# Patient Record
Sex: Female | Born: 1955 | Race: White | Hispanic: No | Marital: Married | Smoking: Never smoker
Health system: Southern US, Community
[De-identification: ages and names within clinical notes are randomized; demographics above are authoritative.]

## PROBLEM LIST (undated history)

## (undated) DIAGNOSIS — S0300XA Dislocation of jaw, unspecified side, initial encounter: Secondary | ICD-10-CM

## (undated) HISTORY — DX: Dislocation of jaw, unspecified side, initial encounter: S03.00XA

## (undated) HISTORY — PX: BREAST BIOPSY: SHX20

## (undated) HISTORY — PX: HEMORRHOID BANDING: SHX5850

---

## 2004-08-17 ENCOUNTER — Ambulatory Visit: Payer: Self-pay | Admitting: Obstetrics and Gynecology

## 2006-04-28 ENCOUNTER — Emergency Department: Payer: Self-pay | Admitting: Emergency Medicine

## 2006-04-28 ENCOUNTER — Other Ambulatory Visit: Payer: Self-pay

## 2006-08-05 ENCOUNTER — Ambulatory Visit: Payer: Self-pay | Admitting: Obstetrics and Gynecology

## 2006-08-12 ENCOUNTER — Ambulatory Visit: Payer: Self-pay | Admitting: Obstetrics and Gynecology

## 2008-08-12 ENCOUNTER — Ambulatory Visit: Payer: Self-pay | Admitting: Obstetrics and Gynecology

## 2009-02-04 HISTORY — PX: FOOT SURGERY: SHX648

## 2010-10-04 ENCOUNTER — Ambulatory Visit: Payer: Self-pay | Admitting: Obstetrics and Gynecology

## 2013-08-20 ENCOUNTER — Other Ambulatory Visit: Payer: Self-pay | Admitting: Otolaryngology

## 2013-08-20 DIAGNOSIS — H9192 Unspecified hearing loss, left ear: Secondary | ICD-10-CM

## 2013-08-28 ENCOUNTER — Other Ambulatory Visit: Payer: Self-pay

## 2013-09-04 ENCOUNTER — Ambulatory Visit
Admission: RE | Admit: 2013-09-04 | Discharge: 2013-09-04 | Disposition: A | Discharge status: Home / Self Care | Payer: Managed Care, Other (non HMO) | Source: Ambulatory Visit | Attending: Otolaryngology | Admitting: Otolaryngology

## 2013-09-04 DIAGNOSIS — H9192 Unspecified hearing loss, left ear: Secondary | ICD-10-CM

## 2013-09-04 MED ORDER — GADOBENATE DIMEGLUMINE 529 MG/ML IV SOLN
10.0000 mL | Freq: Once | INTRAVENOUS | Status: AC | PRN
  Administered 2013-09-04: 10 mL via INTRAVENOUS

## 2014-01-11 ENCOUNTER — Ambulatory Visit: Payer: Self-pay | Admitting: Obstetrics and Gynecology

## 2014-12-06 ENCOUNTER — Ambulatory Visit: Payer: Self-pay | Admitting: General Surgery

## 2014-12-12 ENCOUNTER — Encounter: Payer: Self-pay | Admitting: *Deleted

## 2014-12-14 ENCOUNTER — Other Ambulatory Visit: Payer: Self-pay | Admitting: Obstetrics and Gynecology

## 2014-12-14 DIAGNOSIS — Z1231 Encounter for screening mammogram for malignant neoplasm of breast: Secondary | ICD-10-CM

## 2014-12-26 ENCOUNTER — Ambulatory Visit (INDEPENDENT_AMBULATORY_CARE_PROVIDER_SITE_OTHER): Payer: Managed Care, Other (non HMO) | Admitting: General Surgery

## 2014-12-26 ENCOUNTER — Encounter: Payer: Self-pay | Admitting: General Surgery

## 2014-12-26 VITALS — BP 110/60 | HR 70 | Resp 12 | Ht 65.0 in | Wt 143.0 lb

## 2014-12-26 DIAGNOSIS — K602 Anal fissure, unspecified: Secondary | ICD-10-CM | POA: Diagnosis not present

## 2014-12-26 DIAGNOSIS — K6289 Other specified diseases of anus and rectum: Secondary | ICD-10-CM

## 2014-12-26 LAB — POC HEMOCCULT BLD/STL (OFFICE/1-CARD/DIAGNOSTIC): FECAL OCCULT BLD: NEGATIVE

## 2014-12-26 MED ORDER — HYDROCORTISONE 2.5 % RE CREA: 1.0000 "application " | TOPICAL_CREAM | Freq: Two times a day (BID) | RECTAL | Status: AC

## 2014-12-26 NOTE — Progress Notes (Signed)
Patient ID: Ashley Banks, female   DOB: Oct 26, 1955, 59 y.o.   MRN: [id number]  Chief Complaint  Patient presents with  . Other    hemorrhoids    HPI Ashley Banks is a 59 y.o. female here today for a evaluation of hemorrhoids. Patient states she has had them for about three month. No bleeding but a lot of burning pain with bowel movements. The pain dissipates quickly after the stool passes the anal opening. She has been aggressively cleansing the area layer area with soap and a terry cloth washcloth. No colonoscopy in the past. She has used Preparation H cream for about 2 months without benefit. Also soaking in Epson salts.  HPI  Past Medical History  Diagnosis Date  . TMJ (dislocation of temporomandibular joint)     Past Surgical History  Procedure Laterality Date  . Foot surgery Bilateral 2011  . Hemorrhoid banding      IN HER 20'S    Family History  Problem Relation Age of Onset  . Breast cancer Maternal Grandmother     Social History Social History  Substance Use Topics  . Smoking status: Never Smoker   . Smokeless tobacco: None  . Alcohol Use: No    Allergies  Allergen Reactions  . Acetaminophen Other (See Comments)    Other Reaction: abdominal cramps/pain, ulcers  . Nsaids Rash    Other Reaction: abdominal cramps/pain, ulcers  . Aspirin Other (See Comments)  . Ibuprofen Other (See Comments)  . Clarithromycin Rash  . Codeine Rash  . Latex Rash  . Penicillins Rash  . Sulfa Antibiotics Rash    Current Outpatient Prescriptions  Medication Sig Dispense Refill  . Cetirizine HCl (ZYRTEC PO) Take by mouth.    . cyclobenzaprine (FLEXERIL) 10 MG tablet TAKE 1 TABLET BY MOUTH 3 TIMES A DAY AS NEEDED FOR MUSCLE SPASM  0  . oxymetazoline (AFRIN) 0.05 % nasal spray Place 1 spray into both nostrils 2 (two) times daily.    . hydrocortisone (ANUSOL-HC) 2.5 % rectal cream Place 1 application rectally 2 (two) times daily. 30 g 1   No current facility-administered  medications for this visit.    Review of Systems Review of Systems  Constitutional: Negative.   Respiratory: Negative.   Cardiovascular: Negative.   Gastrointestinal: Positive for rectal pain. Negative for nausea, vomiting, abdominal pain, diarrhea, constipation, blood in stool, abdominal distention and anal bleeding.    Blood pressure 110/60, pulse 70, resp. rate 12, height  (1.651 m), weight 143 lb (64.864 kg).  Physical Exam Physical Exam  Constitutional: She is oriented to person, place, and time. She appears well-developed and well-nourished.  Cardiovascular: Normal rate, regular rhythm and normal heart sounds.   Pulmonary/Chest: Effort normal and breath sounds normal.  Abdominal: Soft. Normal appearance and bowel sounds are normal. There is no tenderness.  Neurological: She is alert and oriented to person, place, and time.  Skin: Skin is warm and dry.   Digital exam showed suggestion of a posterior fissure at the 5-6:00 position (dorsal lithotomy). Normal sphincter tone. Stool was Hemoccult negative.  5 mm noninflamed skin tag at 12:00. No fistula openings noted. Mild perianal skin irritation from vigorous cleansing.  Data Reviewed None available.  Assessment    Suspected anal fissure.    Plan    The patient was encouraged to discontinue using soap on the perineum. She has a handheld shower and this should do adequate job for keeping the area clean and minimize defatting the skin.  A trial of Anusol HC cream to be applied to the posterior aspect of the anus was encouraged. This we done twice a day. Indication for colonoscopy was reviewed.  The patient will call in 2 weeks with a progress report.      PCP:  Nickola MajorHedrick, James  Hernando Reali W 12/26/2014, 5:21 PM

## 2014-12-26 NOTE — Patient Instructions (Signed)
Patient to call us in one month.

## 2015-01-13 ENCOUNTER — Ambulatory Visit: Payer: Self-pay | Attending: Obstetrics and Gynecology

## 2016-06-11 ENCOUNTER — Other Ambulatory Visit: Payer: Self-pay | Admitting: Obstetrics and Gynecology

## 2016-06-11 DIAGNOSIS — Z1231 Encounter for screening mammogram for malignant neoplasm of breast: Secondary | ICD-10-CM

## 2016-07-02 ENCOUNTER — Ambulatory Visit
Admission: RE | Admit: 2016-07-02 | Discharge: 2016-07-02 | Disposition: A | Discharge status: Home / Self Care | Payer: Managed Care, Other (non HMO) | Source: Ambulatory Visit | Attending: Obstetrics and Gynecology | Admitting: Obstetrics and Gynecology

## 2016-07-02 DIAGNOSIS — Z1231 Encounter for screening mammogram for malignant neoplasm of breast: Secondary | ICD-10-CM | POA: Diagnosis present

## 2017-06-19 ENCOUNTER — Other Ambulatory Visit: Payer: Self-pay | Admitting: Obstetrics and Gynecology

## 2017-06-19 DIAGNOSIS — Z1231 Encounter for screening mammogram for malignant neoplasm of breast: Secondary | ICD-10-CM

## 2017-08-04 ENCOUNTER — Ambulatory Visit
Admission: RE | Admit: 2017-08-04 | Discharge: 2017-08-04 | Disposition: A | Discharge status: Home / Self Care | Payer: Managed Care, Other (non HMO) | Source: Ambulatory Visit | Attending: Obstetrics and Gynecology | Admitting: Obstetrics and Gynecology

## 2017-08-04 DIAGNOSIS — Z1231 Encounter for screening mammogram for malignant neoplasm of breast: Secondary | ICD-10-CM | POA: Insufficient documentation

## 2018-08-11 ENCOUNTER — Other Ambulatory Visit: Payer: Self-pay | Admitting: Obstetrics and Gynecology

## 2018-08-11 DIAGNOSIS — Z1231 Encounter for screening mammogram for malignant neoplasm of breast: Secondary | ICD-10-CM

## 2018-12-09 ENCOUNTER — Ambulatory Visit
Admission: RE | Admit: 2018-12-09 | Discharge: 2018-12-09 | Disposition: A | Discharge status: Home / Self Care | Payer: Managed Care, Other (non HMO) | Source: Ambulatory Visit | Attending: Obstetrics and Gynecology | Admitting: Obstetrics and Gynecology

## 2018-12-09 ENCOUNTER — Other Ambulatory Visit: Payer: Self-pay

## 2018-12-09 DIAGNOSIS — Z1231 Encounter for screening mammogram for malignant neoplasm of breast: Secondary | ICD-10-CM | POA: Diagnosis present

## 2019-01-11 ENCOUNTER — Ambulatory Visit: Payer: Self-pay | Admitting: Podiatry

## 2020-03-15 ENCOUNTER — Other Ambulatory Visit: Payer: Self-pay

## 2020-03-15 ENCOUNTER — Other Ambulatory Visit: Payer: Self-pay | Admitting: Obstetrics and Gynecology

## 2020-03-15 ENCOUNTER — Ambulatory Visit
Admission: RE | Admit: 2020-03-15 | Discharge: 2020-03-15 | Disposition: A | Discharge status: Home / Self Care | Payer: BC Managed Care – PPO | Source: Ambulatory Visit | Attending: Obstetrics and Gynecology | Admitting: Obstetrics and Gynecology

## 2020-03-15 DIAGNOSIS — Z1231 Encounter for screening mammogram for malignant neoplasm of breast: Secondary | ICD-10-CM

## 2021-02-26 ENCOUNTER — Other Ambulatory Visit: Payer: Self-pay | Admitting: Obstetrics and Gynecology

## 2021-02-26 DIAGNOSIS — Z1231 Encounter for screening mammogram for malignant neoplasm of breast: Secondary | ICD-10-CM

## 2021-07-04 ENCOUNTER — Ambulatory Visit
Admission: RE | Admit: 2021-07-04 | Discharge: 2021-07-04 | Disposition: A | Discharge status: Home / Self Care | Payer: BC Managed Care – PPO | Source: Ambulatory Visit | Attending: Obstetrics and Gynecology | Admitting: Obstetrics and Gynecology

## 2021-07-04 DIAGNOSIS — Z1231 Encounter for screening mammogram for malignant neoplasm of breast: Secondary | ICD-10-CM | POA: Diagnosis present

## 2022-05-01 ENCOUNTER — Other Ambulatory Visit: Payer: Self-pay | Admitting: Obstetrics and Gynecology

## 2022-05-01 DIAGNOSIS — Z1231 Encounter for screening mammogram for malignant neoplasm of breast: Secondary | ICD-10-CM

## 2022-07-23 ENCOUNTER — Ambulatory Visit
Admission: RE | Admit: 2022-07-23 | Discharge: 2022-07-23 | Disposition: A | Discharge status: Home / Self Care | Payer: BC Managed Care – PPO | Source: Ambulatory Visit | Attending: Obstetrics and Gynecology | Admitting: Obstetrics and Gynecology

## 2022-07-23 DIAGNOSIS — Z1231 Encounter for screening mammogram for malignant neoplasm of breast: Secondary | ICD-10-CM | POA: Insufficient documentation

## 2023-05-26 ENCOUNTER — Other Ambulatory Visit: Payer: Self-pay | Admitting: Obstetrics and Gynecology

## 2023-05-26 DIAGNOSIS — Z1231 Encounter for screening mammogram for malignant neoplasm of breast: Secondary | ICD-10-CM

## 2023-09-21 IMAGING — MG MM DIGITAL SCREENING BILAT W/ TOMO AND CAD
6 of 10 series · 6 of 30 positions shown · non-contrast
Comparison: Previous exam(s).

CLINICAL DATA: Screening.

EXAM:
DIGITAL SCREENING BILATERAL MAMMOGRAM WITH TOMOSYNTHESIS AND CAD
TECHNIQUE: Bilateral screening digital craniocaudal and mediolateral oblique
mammograms were obtained. Bilateral screening digital breast
tomosynthesis was performed. The images were evaluated with
computer-aided detection.

[L MLO synth-2D]
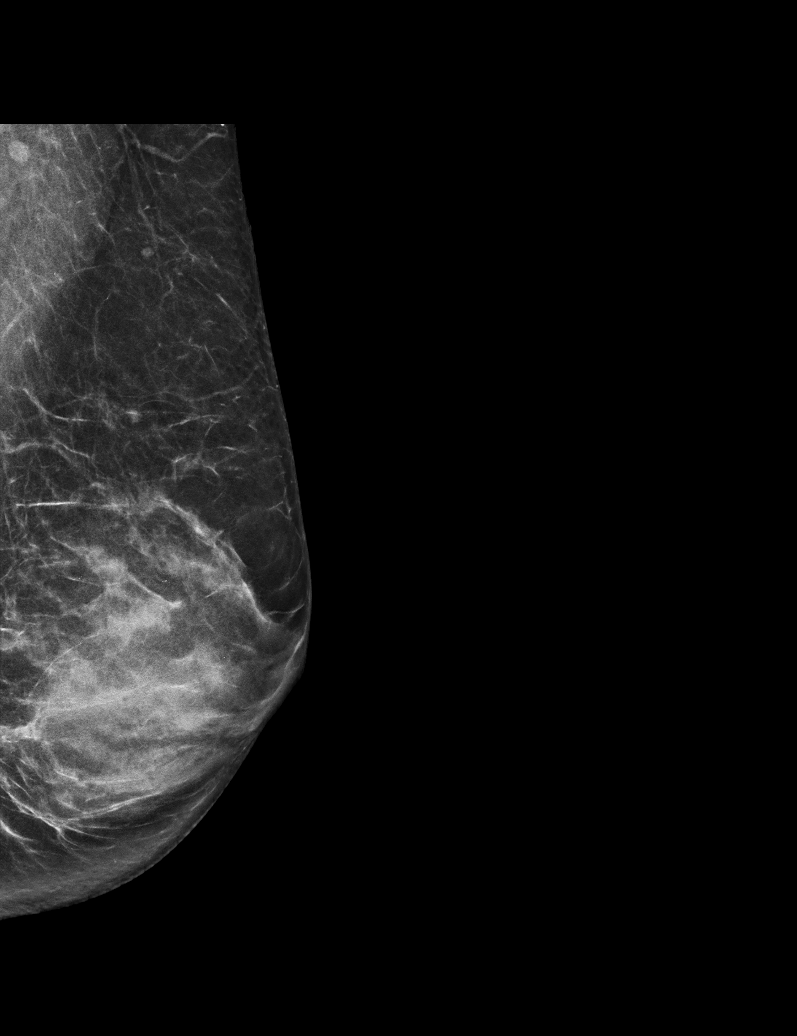

[R CC synth-2D (1 of 2)]
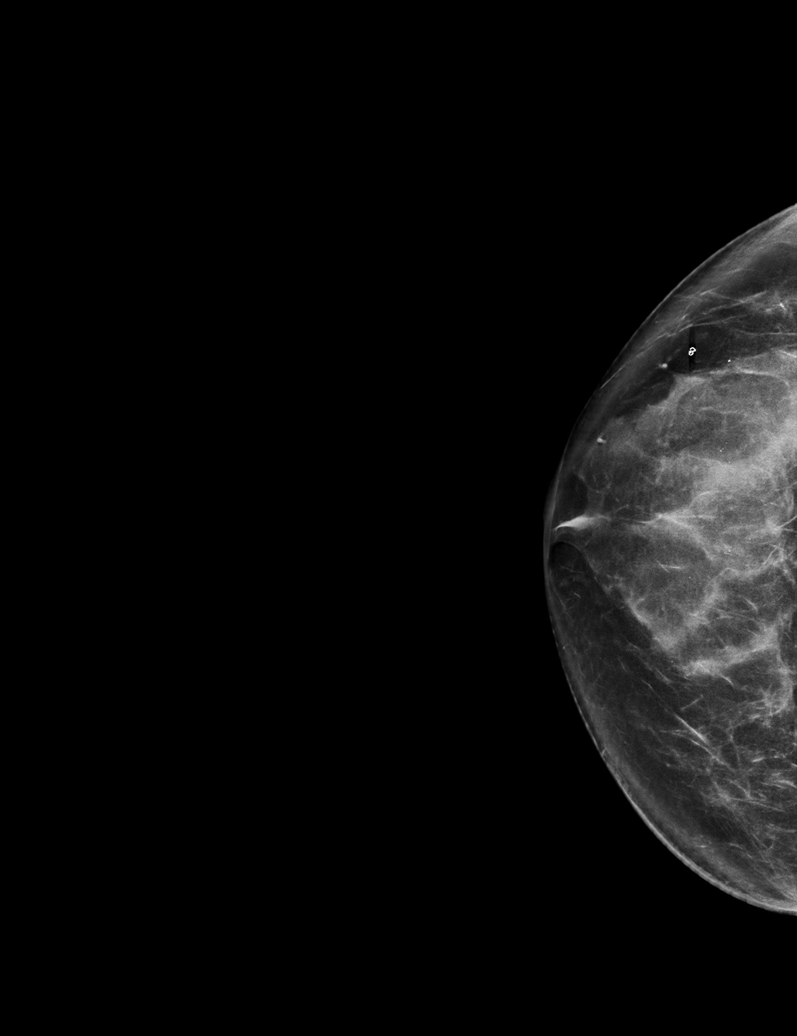

[R MLO synth-2D]
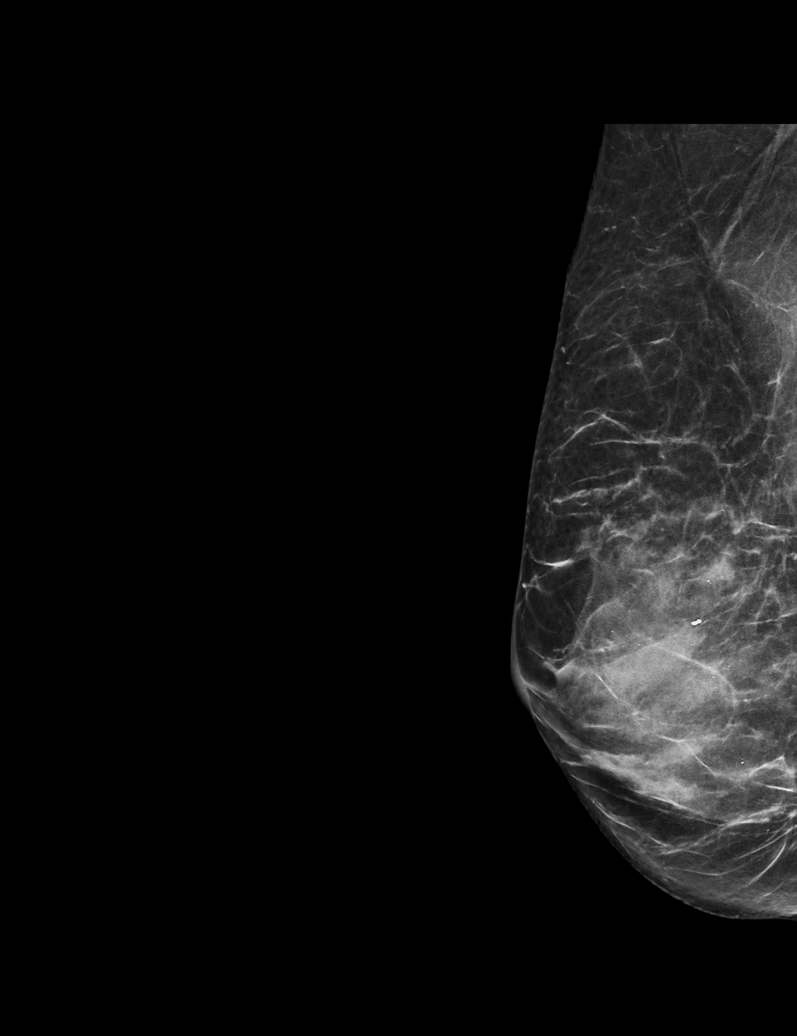

[R CC synth-2D (2 of 2)]
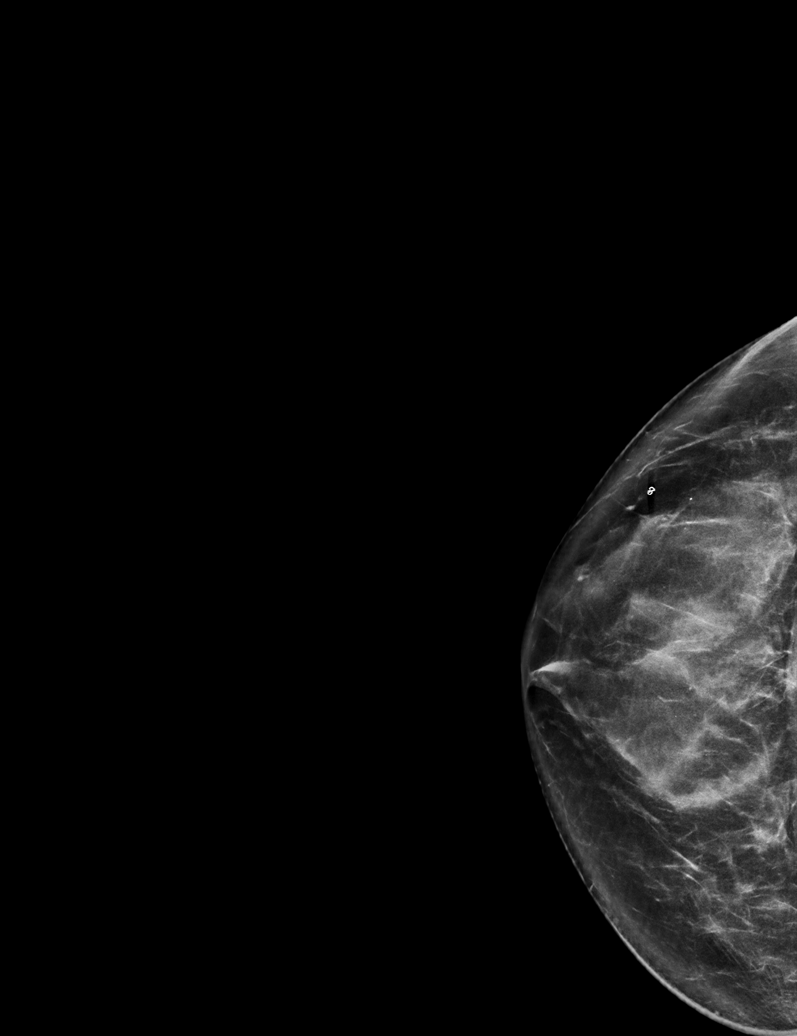

[L CC synth-2D]
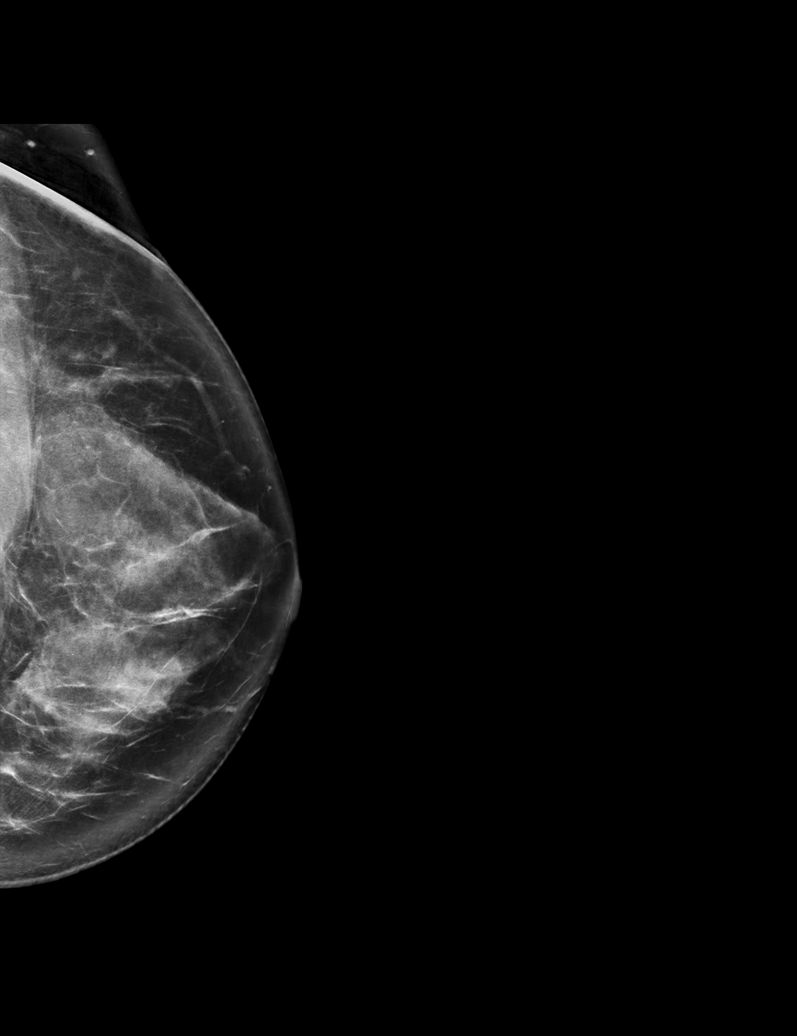

[R CC tomo · tomo slice 35/69.0]
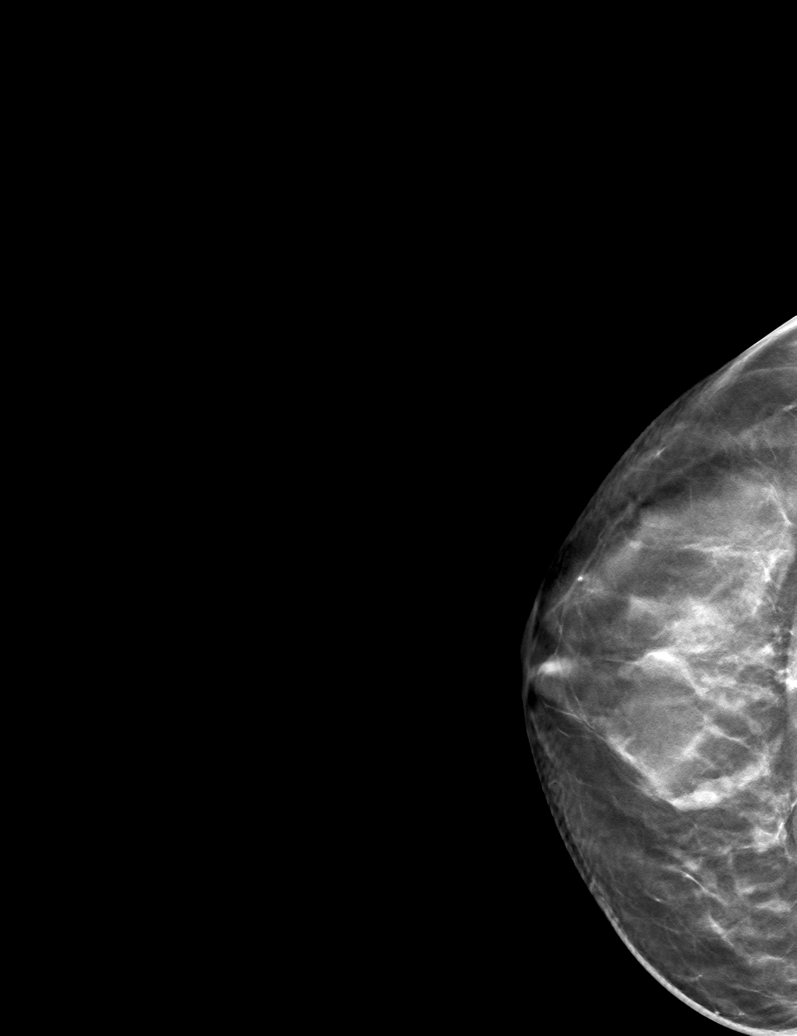

[6 of 30 positions shown; findings below may reference images not displayed]

ACR Breast Density Category d: The breast tissue is extremely dense,
which lowers the sensitivity of mammography
FINDINGS: There are no findings suspicious for malignancy.
IMPRESSION: No mammographic evidence of malignancy. A result letter of this
screening mammogram will be mailed directly to the patient.

RECOMMENDATION:
Screening mammogram in one year. (Code:TA-V-WV9)

BI-RADS CATEGORY  1: Negative.
